# Patient Record
Sex: Male | Born: 1980 | Race: Asian | Hispanic: No | Marital: Single | Smoking: Current every day smoker
Health system: Southern US, Community
[De-identification: ages and names within clinical notes are randomized; demographics above are authoritative.]

## PROBLEM LIST (undated history)

## (undated) HISTORY — PX: RHINOPLASTY: SUR1284

## (undated) HISTORY — PX: MANDIBLE SURGERY: SHX707

---

## 2013-07-27 ENCOUNTER — Ambulatory Visit: Payer: Self-pay | Admitting: Physician Assistant

## 2013-12-29 DIAGNOSIS — F419 Anxiety disorder, unspecified: Secondary | ICD-10-CM | POA: Insufficient documentation

## 2013-12-29 DIAGNOSIS — F5101 Primary insomnia: Secondary | ICD-10-CM | POA: Insufficient documentation

## 2014-06-22 ENCOUNTER — Ambulatory Visit
Admission: EM | Admit: 2014-06-22 | Discharge: 2014-06-22 | Disposition: A | Discharge status: Home / Self Care | Payer: BLUE CROSS/BLUE SHIELD | Attending: Family Medicine | Admitting: Family Medicine

## 2014-06-22 DIAGNOSIS — J40 Bronchitis, not specified as acute or chronic: Secondary | ICD-10-CM

## 2014-06-22 DIAGNOSIS — J9801 Acute bronchospasm: Secondary | ICD-10-CM

## 2014-06-22 MED ORDER — AZITHROMYCIN 250 MG PO TABS: ORAL_TABLET | ORAL | Status: DC

## 2014-06-22 MED ORDER — IPRATROPIUM-ALBUTEROL 0.5-2.5 (3) MG/3ML IN SOLN
3.0000 mL | Freq: Once | RESPIRATORY_TRACT | Status: AC
  Administered 2014-06-22: 3 mL via RESPIRATORY_TRACT

## 2014-06-22 MED ORDER — ALBUTEROL SULFATE HFA 108 (90 BASE) MCG/ACT IN AERS: 2.0000 | INHALATION_SPRAY | Freq: Four times a day (QID) | RESPIRATORY_TRACT | Status: DC | PRN

## 2014-06-22 MED ORDER — GUAIFENESIN-CODEINE 100-10 MG/5ML PO SOLN: 10.0000 mL | Freq: Four times a day (QID) | ORAL | Status: DC | PRN

## 2014-06-22 NOTE — ED Notes (Signed)
Complaints of productive cough x 2-3 weeks, mainly worst at night, yesterday with new onset of congestion, body aches, and CP with cough. Denies Fever, reports chills.

## 2014-06-22 NOTE — ED Provider Notes (Signed)
CSN: [id number]     Arrival date & time 06/22/14  1026 History   First MD Initiated Contact with Patient 06/22/14 1132     Chief Complaint  Patient presents with  . URI   (Consider location/radiation/quality/duration/timing/severity/associated sxs/prior Treatment) HPI Comments: smoker  Patient is a 34 y.o. male presenting with URI. The history is provided by the patient.  URI Presenting symptoms: congestion, cough and facial pain   Severity:  Moderate Onset quality:  Gradual Duration:  2 weeks Timing:  Constant Progression:  Worsening Chronicity:  New Relieved by:  Nothing Ineffective treatments:  OTC medications Associated symptoms: sinus pain and wheezing     History reviewed. No pertinent past medical history. Past Surgical History  Procedure Laterality Date  . Rhinoplasty     History reviewed. No pertinent family history. History  Substance Use Topics  . Smoking status: Current Every Day Smoker -- 1.00 packs/day    Types: Cigarettes  . Smokeless tobacco: Not on file  . Alcohol Use: Yes     Comment: Occasionally    Review of Systems  HENT: Positive for congestion.   Respiratory: Positive for cough and wheezing.     Allergies  Review of patient's allergies indicates no known allergies.  Home Medications   Prior to Admission medications   Medication Sig Start Date End Date Taking? Authorizing Provider  albuterol (PROVENTIL HFA;VENTOLIN HFA) 108 (90 BASE) MCG/ACT inhaler Inhale 2 puffs into the lungs every 6 (six) hours as needed for wheezing or shortness of breath. 06/22/14   Payton Mccallumrlando Macauley Mossberg, MD  azithromycin (ZITHROMAX Z-PAK) 250 MG tablet 2 tabs po once day 1, then 1 tab po qd for next 4 days 06/22/14   Payton Mccallumrlando Jerris Fleer, MD  guaiFENesin-codeine 100-10 MG/5ML syrup Take 10 mLs by mouth every 6 (six) hours as needed for cough. 06/22/14   Payton Mccallumrlando Natori Gudino, MD   BP 131/80 mmHg  Pulse 108  Temp(Src) 98.8 F (37.1 C) (Oral)  Resp 18  Ht 5\' 9"  (1.753 m)  Wt 170 lb  (77.111 kg)  BMI 25.09 kg/m2  SpO2 98% Physical Exam  Constitutional: He appears well-developed and well-nourished. No distress.  HENT:  Head: Normocephalic and atraumatic.  Right Ear: Tympanic membrane, external ear and ear canal normal.  Left Ear: Tympanic membrane, external ear and ear canal normal.  Nose: Nose normal.  Mouth/Throat: Uvula is midline, oropharynx is clear and moist and mucous membranes are normal. No oropharyngeal exudate or tonsillar abscesses.  Eyes: Conjunctivae and EOM are normal. Pupils are equal, round, and reactive to light. Right eye exhibits no discharge. Left eye exhibits no discharge. No scleral icterus.  Neck: Normal range of motion. Neck supple. No tracheal deviation present. No thyromegaly present.  Cardiovascular: Normal rate, regular rhythm and normal heart sounds.   Pulmonary/Chest: Effort normal. No stridor. No respiratory distress. He has wheezes (diffuse rhonchi and wheezes bilaterally). He exhibits no tenderness.  Lymphadenopathy:    He has no cervical adenopathy.  Neurological: He is alert.  Skin: Skin is warm and dry. No rash noted. He is not diaphoretic.  Nursing note and vitals reviewed.   ED Course  Procedures (including critical care time) Labs Review Labs Reviewed - No data to display  Imaging Review No results found.   MDM   1. Bronchitis   2. Bronchospasm    Plan: 1. Diagnosis reviewed with patient 2. rx as per orders; risks, benefits, potential side effects reviewed with patient 3. Patient given Duoneb treatment in clinic with improvement of symptoms  4. Recommend supportive treatment with rest, increased fluids, otc analgesics 5. F/u prn if symptoms worsen or don't improve    Payton Mccallum, MD 06/22/14 1213

## 2014-08-19 ENCOUNTER — Encounter: Payer: Self-pay | Admitting: Family Medicine

## 2014-08-19 ENCOUNTER — Ambulatory Visit (INDEPENDENT_AMBULATORY_CARE_PROVIDER_SITE_OTHER): Payer: BLUE CROSS/BLUE SHIELD | Admitting: Family Medicine

## 2014-08-19 VITALS — BP 100/68 | HR 72 | Ht 69.0 in | Wt 171.0 lb

## 2014-08-19 DIAGNOSIS — F17200 Nicotine dependence, unspecified, uncomplicated: Secondary | ICD-10-CM

## 2014-08-19 DIAGNOSIS — J40 Bronchitis, not specified as acute or chronic: Secondary | ICD-10-CM | POA: Diagnosis not present

## 2014-08-19 DIAGNOSIS — Z720 Tobacco use: Secondary | ICD-10-CM

## 2014-08-19 MED ORDER — DOXYCYCLINE HYCLATE 100 MG PO CAPS: 100.0000 mg | ORAL_CAPSULE | Freq: Two times a day (BID) | ORAL | Status: DC

## 2014-08-19 MED ORDER — VARENICLINE TARTRATE 0.5 MG PO TABS: 0.5000 mg | ORAL_TABLET | Freq: Two times a day (BID) | ORAL | Status: DC

## 2014-08-19 MED ORDER — VARENICLINE TARTRATE 1 MG PO TABS: 1.0000 mg | ORAL_TABLET | Freq: Two times a day (BID) | ORAL | Status: DC

## 2014-08-20 DIAGNOSIS — F17200 Nicotine dependence, unspecified, uncomplicated: Secondary | ICD-10-CM | POA: Insufficient documentation

## 2014-08-20 NOTE — Progress Notes (Signed)
Date:  08/19/2014   Name:  Collin Simpson   DOB:  09/04/1980   MRN:  [id number]  PCP:  Schuyler Amor, MD    Chief Complaint: Cough and Nicotine Dependence   History of Present Illness:  This is a 34 y.o. male c/o productive cough past 2-3 months, seen [facility] given abx but never filled rx. Occ chest pain with cough. Smokes 1/2-`1 ppd for 20 yrs, has tried patches, gum, vapes without success, wants to try Chantix as has helped family members.  Review of Systems:  Review of Systems  Constitutional: Negative for fever and unexpected weight change.  HENT: Negative for ear pain, rhinorrhea, sinus pressure and sore throat.   Eyes: Negative for pain.  Respiratory: Negative for cough and shortness of breath.   Cardiovascular: Negative for chest pain and leg swelling.  Gastrointestinal: Negative for abdominal pain.  Endocrine: Negative for polyuria.  Genitourinary: Negative for difficulty urinating.  Musculoskeletal: Negative for back pain.  Skin: Negative for rash.  Neurological: Negative for seizures and syncope.  Hematological: Negative for adenopathy.  Psychiatric/Behavioral: Negative for confusion and decreased concentration.    Patient Active Problem List   Diagnosis Date Noted  . Anxiety 12/29/2013  . Idiopathic insomnia 12/29/2013    Prior to Admission medications   Medication Sig Start Date End Date Taking? Authorizing Provider  clobetasol (OLUX) 0.05 % topical foam Apply 1 application topically daily. 08/08/14  Yes Historical Provider, MD  Clobetasol Propionate 0.05 % shampoo Apply 1 application topically every other day. 08/08/14  Yes Historical Provider, MD  doxycycline (VIBRAMYCIN) 100 MG capsule Take 1 capsule (100 mg total) by mouth 2 (two) times daily. 08/19/14   Schuyler Amor, MD  varenicline (CHANTIX CONTINUING MONTH PAK) 1 MG tablet Take 1 tablet (1 mg total) by mouth 2 (two) times daily. 08/19/14   Schuyler Amor, MD  varenicline (CHANTIX) 0.5 MG tablet Take 1 tablet (0.5  mg total) by mouth 2 (two) times daily. 08/19/14   Schuyler Amor, MD    No Known Allergies  Past Surgical History  Procedure Laterality Date  . Rhinoplasty    . Mandible surgery      dislocated jaw    History  Substance Use Topics  . Smoking status: Current Every Day Smoker -- 1.00 packs/day    Types: Cigarettes  . Smokeless tobacco: Not on file  . Alcohol Use: Yes     Comment: Occasionally    Family History  Problem Relation Age of Onset  . Family history unknown: Yes    Medication list has been reviewed and updated.  Physical Examination: BP 100/68 mmHg  Pulse 72  Ht 5\' 9"  (1.753 m)  Wt 171 lb (77.565 kg)  BMI 25.24 kg/m2  Physical Exam  Constitutional: He is oriented to person, place, and time. He appears well-developed and well-nourished. No distress.  HENT:  Head: Normocephalic and atraumatic.  Right Ear: External ear normal.  Left Ear: External ear normal.  Nose: Nose normal.  Eyes: Conjunctivae and EOM are normal. Pupils are equal, round, and reactive to light.  Neck: Neck supple. No thyromegaly present.  Cardiovascular: Normal rate, regular rhythm and normal heart sounds.   Pulmonary/Chest: Effort normal and breath sounds normal.  Abdominal: Soft. He exhibits no distension and no mass. There is no guarding.  Musculoskeletal: He exhibits no edema.  Lymphadenopathy:    He has no cervical adenopathy.  Neurological: He is alert and oriented to person, place, and time. Coordination normal.  Skin: Skin is warm  and dry. No rash noted.  Psychiatric: He has a normal mood and affect. His behavior is normal.    Assessment and Plan:  1. Bronchitis - doxycycline (VIBRAMYCIN) 100 MG capsule; Take 1 capsule (100 mg total) by mouth 2 (two) times daily.  Dispense: 14 capsule; Refill: 0  2. Smoker - varenicline (CHANTIX) 0.5 MG tablet; Take 1 tablet (0.5 mg total) by mouth 2 (two) times daily.  Dispense: 14 tablet; Refill: 0 - varenicline (CHANTIX CONTINUING MONTH  PAK) 1 MG tablet; Take 1 tablet (1 mg total) by mouth 2 (two) times daily.  Dispense: 60 tablet; Refill: 2    No Follow-up on file.  Dionne Ano. Kingsley Spittle MD [facility]  08/20/2014

## 2017-04-18 ENCOUNTER — Other Ambulatory Visit: Payer: Self-pay

## 2017-04-18 ENCOUNTER — Emergency Department: Payer: BLUE CROSS/BLUE SHIELD

## 2017-04-18 ENCOUNTER — Emergency Department
Admission: EM | Admit: 2017-04-18 | Discharge: 2017-04-18 | Disposition: A | Discharge status: Home / Self Care | Payer: BLUE CROSS/BLUE SHIELD | Attending: Emergency Medicine | Admitting: Emergency Medicine

## 2017-04-18 DIAGNOSIS — F10120 Alcohol abuse with intoxication, uncomplicated: Secondary | ICD-10-CM | POA: Diagnosis not present

## 2017-04-18 DIAGNOSIS — F10920 Alcohol use, unspecified with intoxication, uncomplicated: Secondary | ICD-10-CM

## 2017-04-18 DIAGNOSIS — Y998 Other external cause status: Secondary | ICD-10-CM | POA: Insufficient documentation

## 2017-04-18 DIAGNOSIS — W0110XA Fall on same level from slipping, tripping and stumbling with subsequent striking against unspecified object, initial encounter: Secondary | ICD-10-CM | POA: Diagnosis not present

## 2017-04-18 DIAGNOSIS — Y9389 Activity, other specified: Secondary | ICD-10-CM | POA: Insufficient documentation

## 2017-04-18 DIAGNOSIS — Y929 Unspecified place or not applicable: Secondary | ICD-10-CM | POA: Diagnosis not present

## 2017-04-18 DIAGNOSIS — F17210 Nicotine dependence, cigarettes, uncomplicated: Secondary | ICD-10-CM | POA: Diagnosis not present

## 2017-04-18 DIAGNOSIS — S0990XA Unspecified injury of head, initial encounter: Secondary | ICD-10-CM | POA: Diagnosis not present

## 2017-04-18 DIAGNOSIS — S0993XA Unspecified injury of face, initial encounter: Secondary | ICD-10-CM | POA: Diagnosis present

## 2017-04-18 DIAGNOSIS — S022XXA Fracture of nasal bones, initial encounter for closed fracture: Secondary | ICD-10-CM | POA: Diagnosis not present

## 2017-04-18 DIAGNOSIS — F419 Anxiety disorder, unspecified: Secondary | ICD-10-CM | POA: Diagnosis not present

## 2017-04-18 DIAGNOSIS — Z79899 Other long term (current) drug therapy: Secondary | ICD-10-CM | POA: Insufficient documentation

## 2017-04-18 LAB — CBC WITH DIFFERENTIAL/PLATELET
BASOS ABS: 0 10*3/uL (ref 0–0.1)
Basophils Relative: 1 %
Eosinophils Absolute: 0 10*3/uL (ref 0–0.7)
Eosinophils Relative: 1 %
HEMATOCRIT: 46.8 % (ref 40.0–52.0)
HEMOGLOBIN: 15.3 g/dL (ref 13.0–18.0)
Lymphocytes Relative: 14 %
Lymphs Abs: 1 10*3/uL (ref 1.0–3.6)
MCH: 29.1 pg (ref 26.0–34.0)
MCHC: 32.6 g/dL (ref 32.0–36.0)
MCV: 89 fL (ref 80.0–100.0)
Monocytes Absolute: 0.4 10*3/uL (ref 0.2–1.0)
Monocytes Relative: 5 %
NEUTROS ABS: 5.5 10*3/uL (ref 1.4–6.5)
NEUTROS PCT: 79 %
Platelets: 223 10*3/uL (ref 150–440)
RBC: 5.25 MIL/uL (ref 4.40–5.90)
RDW: 13.1 % (ref 11.5–14.5)
WBC: 7 10*3/uL (ref 3.8–10.6)

## 2017-04-18 LAB — COMPREHENSIVE METABOLIC PANEL
ALBUMIN: 5 g/dL (ref 3.5–5.0)
ALK PHOS: 66 U/L (ref 38–126)
ALT: 151 U/L — ABNORMAL HIGH (ref 17–63)
ANION GAP: 17 — AB (ref 5–15)
AST: 218 U/L — AB (ref 15–41)
BUN: 14 mg/dL (ref 6–20)
CO2: 20 mmol/L — AB (ref 22–32)
Calcium: 8.8 mg/dL — ABNORMAL LOW (ref 8.9–10.3)
Chloride: 102 mmol/L (ref 101–111)
Creatinine, Ser: 1.22 mg/dL (ref 0.61–1.24)
GFR calc Af Amer: >60 mL/min (ref >60)
GFR calc non Af Amer: >60 mL/min (ref >60)
GLUCOSE: 145 mg/dL — AB (ref 65–99)
Potassium: 3.4 mmol/L — ABNORMAL LOW (ref 3.5–5.1)
SODIUM: 139 mmol/L (ref 135–145)
Total Bilirubin: 0.6 mg/dL (ref 0.3–1.2)
Total Protein: 8.9 g/dL — ABNORMAL HIGH (ref 6.5–8.1)

## 2017-04-18 LAB — ETHANOL: Alcohol, Ethyl (B): 254 mg/dL — ABNORMAL HIGH (ref <10)

## 2017-04-18 LAB — TROPONIN I: Troponin I: <0.03 ng/mL (ref <0.03)

## 2017-04-18 MED ORDER — SODIUM CHLORIDE 0.9 % IV BOLUS
1000.0000 mL | Freq: Once | INTRAVENOUS | Status: AC
Start: 2017-04-18 — End: 2017-04-18
  Administered 2017-04-18: 1000 mL via INTRAVENOUS

## 2017-04-18 MED ORDER — SODIUM CHLORIDE 0.9 % IV BOLUS
1000.0000 mL | Freq: Once | INTRAVENOUS | Status: AC
  Administered 2017-04-18: 1000 mL via INTRAVENOUS

## 2017-04-18 NOTE — ED Triage Notes (Addendum)
Patient coming from home fall in garage after drinking alcohol since 7pm. Patient denies any drugs. EMS states that friends were doing CPR because patient fell forward and stopped breathing according to friends. Patient didn't want to come. Patient A&O x4. Patient answering questions. Has small amount of blood on face.

## 2017-04-18 NOTE — Discharge Instructions (Signed)
1.  Apply ice to affected area several times daily to reduce swelling. 2.  You may take Tylenol and/or ibuprofen as needed for pain. 3.  Drink alcohol only in moderation. 4.  Return to the ER for worsening symptoms, persistent vomiting, lethargy, difficulty breathing or other concerns.

## 2017-04-18 NOTE — ED Notes (Signed)
Pt discharged to lobby to wait for self pay cab.
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
## 2017-08-09 ENCOUNTER — Other Ambulatory Visit: Payer: Self-pay

## 2017-08-09 ENCOUNTER — Ambulatory Visit
Admission: EM | Admit: 2017-08-09 | Discharge: 2017-08-09 | Disposition: A | Discharge status: Home / Self Care | Payer: BLUE CROSS/BLUE SHIELD | Attending: Family Medicine | Admitting: Family Medicine

## 2017-08-09 DIAGNOSIS — M542 Cervicalgia: Secondary | ICD-10-CM | POA: Diagnosis not present

## 2017-08-09 DIAGNOSIS — M5489 Other dorsalgia: Secondary | ICD-10-CM | POA: Diagnosis not present

## 2017-08-09 DIAGNOSIS — S0001XA Abrasion of scalp, initial encounter: Secondary | ICD-10-CM

## 2017-08-09 DIAGNOSIS — M791 Myalgia, unspecified site: Secondary | ICD-10-CM

## 2017-08-09 DIAGNOSIS — H538 Other visual disturbances: Secondary | ICD-10-CM | POA: Diagnosis not present

## 2017-08-09 MED ORDER — TRAMADOL HCL 50 MG PO TABS: 50.0000 mg | ORAL_TABLET | Freq: Three times a day (TID) | ORAL | 0 refills | Status: DC | PRN

## 2017-08-09 MED ORDER — METAXALONE 800 MG PO TABS: 800.0000 mg | ORAL_TABLET | Freq: Three times a day (TID) | ORAL | 0 refills | Status: DC

## 2017-08-09 NOTE — Discharge Instructions (Addendum)
Rest. Heat.  Use the metaxalone 3 times daily as needed for muscle pain/spasm. Use the tramadol for pain if needed.  You will slowly improve.  Take care  Dr. Adriana Simasook
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
## 2020-01-23 IMAGING — DX DG CHEST 1V PORT
1 series · 1 of 1 positions shown · non-contrast
Comparison: Chest radiograph dated 07/27/2013

CLINICAL DATA: 36-year-old male with overdose.

EXAM:
PORTABLE CHEST 1 VIEW

[chest ap]
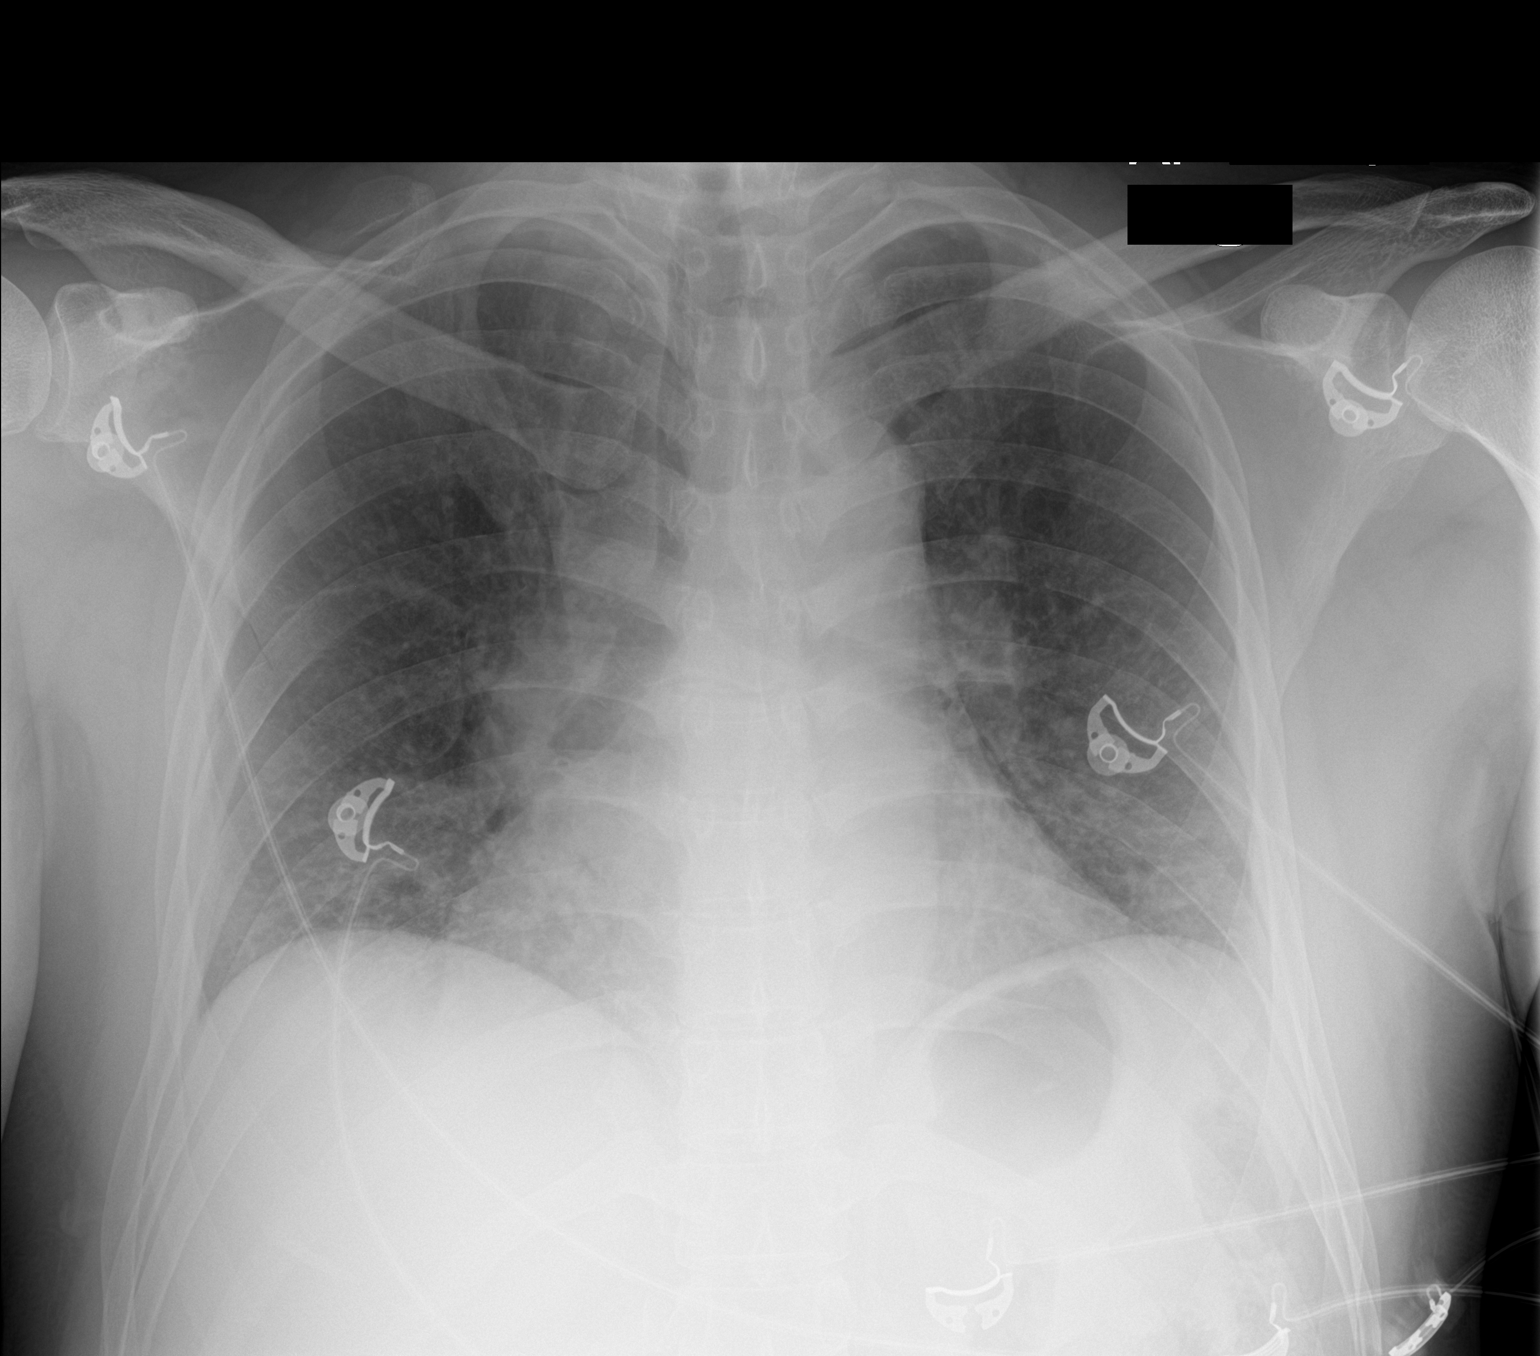

[1 of 1 positions shown; findings below may reference images not displayed]

FINDINGS: Shallow inspiration with minimal bibasilar atelectasis. No focal
consolidation, pleural effusion, or pneumothorax. Top-normal cardiac
silhouette. No acute osseous pathology.
IMPRESSION: No active disease.

## 2023-01-04 ENCOUNTER — Ambulatory Visit
Admission: EM | Admit: 2023-01-04 | Discharge: 2023-01-04 | Disposition: A | Discharge status: Home / Self Care | Payer: BLUE CROSS/BLUE SHIELD | Attending: Family Medicine | Admitting: Family Medicine

## 2023-01-04 ENCOUNTER — Ambulatory Visit: Payer: BLUE CROSS/BLUE SHIELD

## 2023-01-04 ENCOUNTER — Encounter: Payer: Self-pay | Admitting: Emergency Medicine

## 2023-01-04 DIAGNOSIS — J189 Pneumonia, unspecified organism: Secondary | ICD-10-CM

## 2023-01-04 DIAGNOSIS — J40 Bronchitis, not specified as acute or chronic: Secondary | ICD-10-CM

## 2023-01-04 DIAGNOSIS — R030 Elevated blood-pressure reading, without diagnosis of hypertension: Secondary | ICD-10-CM

## 2023-01-04 MED ORDER — PREDNISONE 10 MG (21) PO TBPK: ORAL_TABLET | Freq: Every day | ORAL | 0 refills | Status: AC

## 2023-01-04 MED ORDER — ALBUTEROL SULFATE HFA 108 (90 BASE) MCG/ACT IN AERS: 2.0000 | INHALATION_SPRAY | RESPIRATORY_TRACT | 0 refills | Status: AC | PRN

## 2023-01-04 MED ORDER — AZITHROMYCIN 250 MG PO TABS: ORAL_TABLET | ORAL | 0 refills | Status: AC

## 2023-01-04 NOTE — ED Provider Notes (Signed)
[facility]    CSN: [id number] Arrival date & time: 01/04/23  1832      History   Chief Complaint No chief complaint on file.   HPI Collin Simpson is a 42 y.o. male.   HPI  History obtained from the patient. Collin Simpson presents for productive cough for the past 2 weeks. Feels it in his chest. Had green mucus but its clear now.  Cough gets worse at night. No fever, rhinorrhea, vomiting and diarrhea. At times during his coughing fits he feels like he needs to throw up but doesn't. Endorses headache with coughing, nasal congestion. Didn't try anything for his cough.  He drank some tea with some relief. His son cough is getting better but his is persisting.      No past medical history on file.  Patient Active Problem List   Diagnosis Date Noted   Smoker 08/20/2014   Anxiety 12/29/2013   Idiopathic insomnia 12/29/2013    Past Surgical History:  Procedure Laterality Date   MANDIBLE SURGERY     dislocated jaw   RHINOPLASTY         Home Medications    Prior to Admission medications   Medication Sig Start Date End Date Taking? Authorizing Provider  metaxalone (SKELAXIN) 800 MG tablet Take 1 tablet (800 mg total) by mouth 3 (three) times daily. 08/09/17   Tommie Sams, DO  traMADol (ULTRAM) 50 MG tablet Take 1 tablet (50 mg total) by mouth every 8 (eight) hours as needed. 08/09/17   Tommie Sams, DO    Family History Family History  Family history unknown: Yes    Social History Social History   Tobacco Use   Smoking status: Every Day    Current packs/day: 1.00    Types: Cigarettes   Smokeless tobacco: Never  Vaping Use   Vaping status: Never Used  Substance Use Topics   Alcohol use: Yes    Comment: Occasionally   Drug use: No     Allergies   Patient has no known allergies.   Review of Systems Review of Systems: negative unless otherwise stated in HPI.      Physical Exam Triage Vital Signs ED Triage Vitals  Encounter Vitals Group      BP      Systolic BP Percentile      Diastolic BP Percentile      Pulse      Resp      Temp      Temp src      SpO2      Weight      Height      Head Circumference      Peak Flow      Pain Score      Pain Loc      Pain Education      Exclude from Growth Chart    No data found.  Updated Vital Signs There were no vitals taken for this visit.  Visual Acuity Right Eye Distance:   Left Eye Distance:   Bilateral Distance:    Right Eye Near:   Left Eye Near:    Bilateral Near:     Physical Exam GEN:     alert, non-toxic appearing male in no distress ***   HENT:  mucus membranes moist, oropharyngeal ***without lesions or ***erythema, no*** tonsillar hypertrophy or exudates, *** moderate erythematous edematous turbinates, ***clear nasal discharge, ***bilateral TM normal EYES:   pupils equal and reactive, ***no scleral injection or discharge  NECK:  normal ROM, no ***lymphadenopathy, ***no meningismus   RESP:  no increased work of breathing, ***clear to auscultation bilaterally CVS:   regular rate ***and rhythm Skin:   warm and dry, no rash on visible skin***    UC Treatments / Results  Labs (all labs ordered are listed, but only abnormal results are displayed) Labs Reviewed - No data to display  EKG   Radiology No results found.  Procedures Procedures (including critical care time)  Medications Ordered in UC Medications - No data to display  Initial Impression / Assessment and Plan / UC Course  I have reviewed the triage vital signs and the nursing notes.  Pertinent labs & imaging results that were available during my care of the patient were reviewed by me and considered in my medical decision making (see chart for details).       Pt is a 42 y.o. male who presents for *** days of respiratory symptoms. [name] is ***afebrile here without recent antipyretics. Satting well on room air. Overall pt is ***non-toxic appearing, well hydrated, without respiratory  distress. Pulmonary exam ***is unremarkable.  COVID testing obtained ***and was negative. ***Pt to quarantine until COVID test results or longer if positive.  I will call patient with test results, if positive. History consistent with ***viral respiratory illness. Discussed symptomatic treatment.  Explained lack of efficacy of antibiotics in viral disease.  Typical duration of symptoms discussed.   Return and ED precautions given and voiced understanding. Discussed MDM, treatment plan and plan for follow-up with patient*** who agrees with plan.     Final Clinical Impressions(s) / UC Diagnoses   Final diagnoses:  None   Discharge Instructions   None    ED Prescriptions   None    PDMP not reviewed this encounter.

## 2023-01-04 NOTE — Discharge Instructions (Signed)
Your chest xray did not show evidence of pneumonia though the radiologist has not yet read it. If they find something that I didn't, I will call you.   Suspect you have bronchitis.  Stop by the pharmacy to pick up your prescriptions.  Follow up with your primary care provider as needed.

## 2023-01-04 NOTE — ED Triage Notes (Signed)
Patient c/o cough and chest congestion for 2 weeks.  Patient denies fevers.  

## 2023-01-05 ENCOUNTER — Telehealth: Payer: Self-pay | Admitting: Family Medicine

## 2023-01-05 DIAGNOSIS — J189 Pneumonia, unspecified organism: Secondary | ICD-10-CM

## 2023-01-05 MED ORDER — AMOXICILLIN-POT CLAVULANATE 875-125 MG PO TABS: 1.0000 | ORAL_TABLET | Freq: Two times a day (BID) | ORAL | 0 refills | Status: AC

## 2023-01-05 NOTE — Telephone Encounter (Signed)
CXR showing LLL pneumonia.  Treat with dual antibiotics therefore Augmentin prescribed.   Spoke with patient about chest xray findings.  He will stop by his pharmacy to pick up his prescriptions.   Katha Cabal, DO
